# Patient Record
Sex: Female | Born: 1995 | Hispanic: Yes | Marital: Single | Smoking: Never smoker
Health system: Southern US, Community
[De-identification: ages and names within clinical notes are randomized; demographics above are authoritative.]

## PROBLEM LIST (undated history)

## (undated) DIAGNOSIS — O99019 Anemia complicating pregnancy, unspecified trimester: Secondary | ICD-10-CM

---
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
## 2018-10-07 ENCOUNTER — Encounter: Payer: Self-pay | Admitting: Emergency Medicine

## 2018-10-07 ENCOUNTER — Other Ambulatory Visit: Payer: Self-pay

## 2018-10-07 DIAGNOSIS — M545 Low back pain: Secondary | ICD-10-CM | POA: Insufficient documentation

## 2018-10-07 LAB — URINALYSIS, COMPLETE (UACMP) WITH MICROSCOPIC
Bacteria, UA: NONE SEEN
Bilirubin Urine: NEGATIVE
Glucose, UA: NEGATIVE mg/dL
Ketones, ur: NEGATIVE mg/dL
Leukocytes,Ua: NEGATIVE
Nitrite: NEGATIVE
Protein, ur: NEGATIVE mg/dL
Specific Gravity, Urine: 1.023 (ref 1.005–1.030)
pH: 6 (ref 5.0–8.0)

## 2018-10-07 LAB — POCT PREGNANCY, URINE: Preg Test, Ur: NEGATIVE

## 2018-10-07 NOTE — ED Triage Notes (Signed)
Patient ambulatory to triage with steady gait, without difficulty or distress noted; pt reports lower back pain since yesterday that increases with movement; denies any accomp symptoms, denies injury or hx of same

## 2018-10-08 ENCOUNTER — Emergency Department
Admission: EM | Admit: 2018-10-08 | Discharge: 2018-10-08 | Disposition: A | Discharge status: Home / Self Care | Payer: Self-pay | Attending: Emergency Medicine | Admitting: Emergency Medicine

## 2018-10-08 ENCOUNTER — Emergency Department: Payer: Self-pay

## 2018-10-08 DIAGNOSIS — M545 Low back pain, unspecified: Secondary | ICD-10-CM

## 2018-10-08 MED ORDER — CYCLOBENZAPRINE HCL 10 MG PO TABS
5.0000 mg | ORAL_TABLET | Freq: Once | ORAL | Status: AC
  Administered 2018-10-08: 02:00:00 5 mg via ORAL
  Filled 2018-10-08: qty 1

## 2018-10-08 MED ORDER — LIDOCAINE 5 % EX PTCH
1.0000 | MEDICATED_PATCH | CUTANEOUS | Status: DC
  Administered 2018-10-08: 1 via TRANSDERMAL
  Filled 2018-10-08: qty 1

## 2018-10-08 MED ORDER — CYCLOBENZAPRINE HCL 5 MG PO TABS: 5.0000 mg | ORAL_TABLET | Freq: Three times a day (TID) | ORAL | 0 refills | Status: DC | PRN

## 2018-10-08 NOTE — ED Notes (Signed)
Patient transported to X-ray 

## 2018-10-08 NOTE — ED Provider Notes (Signed)
[facility] Emergency Department Provider Note ______________   First MD Initiated Contact with Patient 10/08/18 0141     (approximate)  I have reviewed the triage vital signs and the nursing notes.   HISTORY  Chief Complaint Back Pain   HPI Christine Knox is a 23 y.o. female presents to the emergency department nontraumatic left lower back pain with no accompanying symptoms.  Patient denies any urinary symptoms no dysuria frequency or urgency.  Patient denies any hematuria.  Patient denies any fever.  Patient denies any abdominal pain no nausea or vomiting.  Patient states that the pain began yesterday and is worsened with movement.        History reviewed. No pertinent past medical history.  There are no active problems to display for this patient.   History reviewed. No pertinent surgical history.  Prior to Admission medications   Not on File    Allergies Patient has no known allergies.  No family history on file.  Social History Social History   Tobacco Use  . Smoking status: Never Smoker  . Smokeless tobacco: Never Used  Substance Use Topics  . Alcohol use: Not on file  . Drug use: Not on file    Review of Systems Constitutional: No fever/chills Eyes: No visual changes. ENT: No sore throat. Cardiovascular: Denies chest pain. Respiratory: Denies shortness of breath. Gastrointestinal: No abdominal pain.  No nausea, no vomiting.  No diarrhea.  No constipation. Genitourinary: Negative for dysuria. Musculoskeletal: Negative for neck pain.  Positive for back pain. Integumentary: Negative for rash. Neurological: Negative for headaches, focal weakness or numbness.  ____________________________________________   PHYSICAL EXAM:  VITAL SIGNS: ED Triage Vitals [10/07/18 2240]  Enc Vitals Group     BP 131/79     Pulse Rate 84     Resp 18     Temp      Temp Source Oral     SpO2 98 %     Weight 48.1 kg (106 lb)     Height  1.549 m (5\' 1" )     Head Circumference      Peak Flow      Pain Score 7     Pain Loc      Pain Edu?      Excl. in [place]?     Constitutional: Alert and oriented. Well appearing and in no acute distress. Eyes: Conjunctivae are normal.  Mouth/Throat: Mucous membranes are moist.  Oropharynx non-erythematous. Neck: No stridor.  Cardiovascular: Normal rate, regular rhythm. Good peripheral circulation. Grossly normal heart sounds. Respiratory: Normal respiratory effort.  No retractions. No audible wheezing. Gastrointestinal: Soft and nontender. No distention.  No CVA tenderness Musculoskeletal: Pain with left lumbar paraspinal/quadratus lumborum palpation Neurologic:  Normal speech and language. No gross focal neurologic deficits are appreciated.  Skin:  Skin is warm, dry and intact. No rash noted. Psychiatric: Mood and affect are normal. Speech and behavior are normal.  ____________________________________________   LABS (all labs ordered are listed, but only abnormal results are displayed)  Labs Reviewed  URINALYSIS, COMPLETE (UACMP) WITH MICROSCOPIC - Abnormal; Notable for the following components:      Result Value   Color, Urine YELLOW (*)    APPearance HAZY (*)    Hgb urine dipstick SMALL (*)    All other components within normal limits  POCT PREGNANCY, URINE   _____________________________ RADIOLOGY I, [place] N Geraldyn Shain, personally viewed and evaluated these images (plain radiographs) as part of my medical decision making, as well  as reviewing the written report by the radiologist.  ED MD interpretation: Negative lumbar spine x-ray  Official radiology report(s): Dg Lumbar Spine Complete  Result Date: 10/08/2018 CLINICAL DATA:  Lumbosacral back pain. EXAM: LUMBAR SPINE - COMPLETE 4+ VIEW COMPARISON:  None. FINDINGS: The alignment is maintained. Vertebral body heights are normal. There is no listhesis. The posterior elements are intact. Disc spaces are preserved. No fracture.  Sacroiliac joints are symmetric and normal. IMPRESSION: Negative radiographs of the lumbar spine. Electronically Signed   By: Narda RutherfordMelanie  Sanford M.D.   On: 10/08/2018 02:24     Procedures   ____________________________________________   INITIAL IMPRESSION / MDM / ASSESSMENT AND PLAN / ED COURSE  As part of my medical decision making, I reviewed the following data within the electronic MEDICAL RECORD NUMBER   23 year old female presenting with above-stated history and physical exam secondary to nontraumatic left lower back pain.  No fever.  No neurological deficits.  Pain does not radiate to the lower extremities.  Lidoderm patch applied patient given Flexeril with improvement of pain.  Patient will be prescribed Flexeril and Lidoderm patch for home.      ____________________________________________  FINAL CLINICAL IMPRESSION(S) / ED DIAGNOSES  Final diagnoses:  Acute left-sided low back pain without sciatica     MEDICATIONS GIVEN DURING THIS VISIT:  Medications  lidocaine (LIDODERM) 5 % 1 patch (1 patch Transdermal Patch Applied 10/08/18 0200)  cyclobenzaprine (FLEXERIL) tablet 5 mg (5 mg Oral Given 10/08/18 0202)     ED Discharge Orders    None      *Please note:  Christine Knox was evaluated in Emergency Department on 10/08/2018 for the symptoms described in the history of present illness. She was evaluated in the context of the global COVID-19 pandemic, which necessitated consideration that the patient might be at risk for infection with the SARS-CoV-2 virus that causes COVID-19. Institutional protocols and algorithms that pertain to the evaluation of patients at risk for COVID-19 are in a state of rapid change based on information released by regulatory bodies including the CDC and federal and state organizations. These policies and algorithms were followed during the patient's care in the ED.  Some ED evaluations and interventions may be delayed as a result of limited staffing  during the pandemic.*  Note:  This document was prepared using Dragon voice recognition software and may include unintentional dictation errors.   Darci CurrentBrown, Stevenson N, MD 10/08/18 [id number]

## 2020-03-19 NOTE — L&D Delivery Note (Signed)
Delivery Note  Christine Knox is a G1P0101 at [redacted]w[redacted]d with an LMP of 04/06/2020, consistent with [place] at [redacted]w[redacted]d.   First Stage: Labor onset: 12/18/2020 at 2000  Augmentation:  None Analgesia /Anesthesia intrapartum: Epidural SROM at 0458  Second Stage: Complete dilation at 1058 Onset of pushing at 1204 FHR second stage 150bpm with moderate variability, variables with contractions during pushing.   Christine Knox, presented to L&D with regular painful contractions and was admitted for preterm labor.  Her labor progressed more rapidly after SROM.  She progressed spontaneously to C/C/+1 but did not have an urge to push d/t the epidural.  The epidural rate was decreased and she reported pressure after an hour.  Christine Knox was assisted with pushing techniques and pushed effectively over approximately 40 minutes for a spontaneous vaginal birth.  Delivery of a viable baby girl on 12/19/2020 at 1245 by CNM Delivery of fetal head in OA position with restitution to ROT. No nuchal cord;  Left, compound hand reduced. Anterior then posterior shoulders delivered easily with gentle downward traction. Baby placed on mom's chest, and attended to by baby RN Cord double clamped after cessation of pulsation, cut by father of baby.   Cord blood sample collected: O pos    Third Stage: Oxytocin bolus started after delivery of infant for hemorrhage prophylaxis  Placenta delivered intact with 3 VC @ 1253 Placenta disposition: discarded  Uterine tone firm / bleeding moderate   Bilateral labial laceration identified  Anesthesia for repair: Epidural  Repair of left labial laceration with 2-0 Vicryl CT in continuous suture to achieve anastomosis.  Laceration extended to left periclitoral.  Hemostatic and approximates well, inline with labial laceration.  No additional suture used for periclitoral extension.   Repair of right labial laceration with 3-0 Vicryl SH in continuous suture to achieve anastomosis.   Est. Blood Loss  (mL): 412 ml  Complications: None  Mom to postpartum.  Baby to Couplet care / Skin to Skin.  Newborn: Information for the patient's newborn:  Christine Knox, Christine Knox [[id number]]  Live born female  Birth Weight: 5 lb 15.2 oz (2700 g) APGAR: 8, 9  Newborn Delivery   Birth date/time: 12/19/2020 12:45:00 Delivery type: Vaginal, Spontaneous        Feeding planned: Breast and formula   ---------- [name], CNM Certified Nurse Midwife [facility]

## 2020-06-22 DIAGNOSIS — R300 Dysuria: Secondary | ICD-10-CM | POA: Diagnosis not present

## 2020-06-28 DIAGNOSIS — Z3400 Encounter for supervision of normal first pregnancy, unspecified trimester: Secondary | ICD-10-CM | POA: Insufficient documentation

## 2020-07-08 DIAGNOSIS — Z1329 Encounter for screening for other suspected endocrine disorder: Secondary | ICD-10-CM | POA: Diagnosis not present

## 2020-07-08 DIAGNOSIS — Z131 Encounter for screening for diabetes mellitus: Secondary | ICD-10-CM | POA: Diagnosis not present

## 2020-07-08 DIAGNOSIS — Z3401 Encounter for supervision of normal first pregnancy, first trimester: Secondary | ICD-10-CM | POA: Diagnosis not present

## 2020-07-08 DIAGNOSIS — N898 Other specified noninflammatory disorders of vagina: Secondary | ICD-10-CM | POA: Diagnosis not present

## 2020-07-08 DIAGNOSIS — N760 Acute vaginitis: Secondary | ICD-10-CM | POA: Diagnosis not present

## 2020-07-08 DIAGNOSIS — B373 Candidiasis of vulva and vagina: Secondary | ICD-10-CM | POA: Diagnosis not present

## 2020-07-08 DIAGNOSIS — B9689 Other specified bacterial agents as the cause of diseases classified elsewhere: Secondary | ICD-10-CM | POA: Diagnosis not present

## 2020-07-08 DIAGNOSIS — O99891 Other specified diseases and conditions complicating pregnancy: Secondary | ICD-10-CM | POA: Diagnosis not present

## 2020-07-08 LAB — OB RESULTS CONSOLE HEPATITIS B SURFACE ANTIGEN: Hepatitis B Surface Ag: NEGATIVE

## 2020-07-08 LAB — OB RESULTS CONSOLE RUBELLA ANTIBODY, IGM: Rubella: NON-IMMUNE/NOT IMMUNE

## 2020-07-08 LAB — OB RESULTS CONSOLE VARICELLA ZOSTER ANTIBODY, IGG: Varicella: IMMUNE

## 2020-08-26 DIAGNOSIS — Z3402 Encounter for supervision of normal first pregnancy, second trimester: Secondary | ICD-10-CM | POA: Diagnosis not present

## 2020-10-17 DIAGNOSIS — Z23 Encounter for immunization: Secondary | ICD-10-CM | POA: Diagnosis not present

## 2020-10-17 DIAGNOSIS — Z3402 Encounter for supervision of normal first pregnancy, second trimester: Secondary | ICD-10-CM | POA: Diagnosis not present

## 2020-11-04 DIAGNOSIS — O26843 Uterine size-date discrepancy, third trimester: Secondary | ICD-10-CM | POA: Diagnosis not present

## 2020-11-18 ENCOUNTER — Other Ambulatory Visit: Payer: Self-pay

## 2020-11-18 DIAGNOSIS — D509 Iron deficiency anemia, unspecified: Secondary | ICD-10-CM | POA: Insufficient documentation

## 2020-11-18 DIAGNOSIS — O26843 Uterine size-date discrepancy, third trimester: Secondary | ICD-10-CM | POA: Insufficient documentation

## 2020-11-18 DIAGNOSIS — O99013 Anemia complicating pregnancy, third trimester: Secondary | ICD-10-CM | POA: Diagnosis not present

## 2020-11-25 ENCOUNTER — Other Ambulatory Visit: Payer: Self-pay

## 2020-11-25 ENCOUNTER — Ambulatory Visit
Admission: RE | Admit: 2020-11-25 | Discharge: 2020-11-25 | Disposition: A | Discharge status: Home / Self Care | Payer: Medicaid Other | Source: Ambulatory Visit

## 2020-11-25 DIAGNOSIS — O99013 Anemia complicating pregnancy, third trimester: Secondary | ICD-10-CM | POA: Insufficient documentation

## 2020-11-25 DIAGNOSIS — D509 Iron deficiency anemia, unspecified: Secondary | ICD-10-CM | POA: Diagnosis not present

## 2020-11-25 DIAGNOSIS — Z3A00 Weeks of gestation of pregnancy not specified: Secondary | ICD-10-CM | POA: Diagnosis not present

## 2020-11-25 MED ORDER — SODIUM CHLORIDE 0.9 % IV SOLN
200.0000 mg | INTRAVENOUS | Status: DC
  Administered 2020-11-25: 200 mg via INTRAVENOUS
  Filled 2020-11-25: qty 10

## 2020-12-02 ENCOUNTER — Other Ambulatory Visit: Payer: Self-pay

## 2020-12-02 ENCOUNTER — Ambulatory Visit
Admission: RE | Admit: 2020-12-02 | Discharge: 2020-12-02 | Disposition: A | Discharge status: Home / Self Care | Payer: Medicaid Other | Source: Ambulatory Visit

## 2020-12-02 DIAGNOSIS — O26843 Uterine size-date discrepancy, third trimester: Secondary | ICD-10-CM | POA: Diagnosis not present

## 2020-12-02 DIAGNOSIS — O99013 Anemia complicating pregnancy, third trimester: Secondary | ICD-10-CM | POA: Diagnosis not present

## 2020-12-02 DIAGNOSIS — D509 Iron deficiency anemia, unspecified: Secondary | ICD-10-CM | POA: Diagnosis not present

## 2020-12-02 HISTORY — DX: Anemia complicating pregnancy, unspecified trimester: O99.019

## 2020-12-02 MED ORDER — SODIUM CHLORIDE 0.9 % IV SOLN
200.0000 mg | Freq: Once | INTRAVENOUS | Status: AC
  Administered 2020-12-02: 200 mg via INTRAVENOUS
  Filled 2020-12-02: qty 10

## 2020-12-09 ENCOUNTER — Encounter
Admission: RE | Admit: 2020-12-09 | Discharge: 2020-12-09 | Disposition: A | Discharge status: Home / Self Care | Payer: BC Managed Care – PPO | Source: Ambulatory Visit

## 2020-12-09 ENCOUNTER — Other Ambulatory Visit: Payer: Self-pay

## 2020-12-09 DIAGNOSIS — Z3A00 Weeks of gestation of pregnancy not specified: Secondary | ICD-10-CM | POA: Diagnosis not present

## 2020-12-09 DIAGNOSIS — O99013 Anemia complicating pregnancy, third trimester: Secondary | ICD-10-CM | POA: Insufficient documentation

## 2020-12-09 DIAGNOSIS — D509 Iron deficiency anemia, unspecified: Secondary | ICD-10-CM | POA: Diagnosis not present

## 2020-12-09 MED ORDER — SODIUM CHLORIDE 0.9 % IV SOLN
200.0000 mg | Freq: Once | INTRAVENOUS | Status: AC
  Administered 2020-12-09: 200 mg via INTRAVENOUS
  Filled 2020-12-09: qty 200

## 2020-12-15 DIAGNOSIS — Z3403 Encounter for supervision of normal first pregnancy, third trimester: Secondary | ICD-10-CM | POA: Diagnosis not present

## 2020-12-15 DIAGNOSIS — O99013 Anemia complicating pregnancy, third trimester: Secondary | ICD-10-CM | POA: Diagnosis not present

## 2020-12-15 DIAGNOSIS — D509 Iron deficiency anemia, unspecified: Secondary | ICD-10-CM | POA: Diagnosis not present

## 2020-12-15 LAB — OB RESULTS CONSOLE GC/CHLAMYDIA
Chlamydia: NEGATIVE
Gonorrhea: NEGATIVE

## 2020-12-15 LAB — OB RESULTS CONSOLE HIV ANTIBODY (ROUTINE TESTING): HIV: NONREACTIVE

## 2020-12-15 LAB — OB RESULTS CONSOLE GBS: GBS: NEGATIVE

## 2020-12-15 LAB — OB RESULTS CONSOLE RPR: RPR: NONREACTIVE

## 2020-12-16 ENCOUNTER — Other Ambulatory Visit: Payer: Self-pay

## 2020-12-16 ENCOUNTER — Ambulatory Visit
Admission: RE | Admit: 2020-12-16 | Discharge: 2020-12-16 | Disposition: A | Discharge status: Home / Self Care | Payer: BLUE CROSS/BLUE SHIELD | Source: Ambulatory Visit

## 2020-12-16 DIAGNOSIS — Z3A00 Weeks of gestation of pregnancy not specified: Secondary | ICD-10-CM | POA: Diagnosis not present

## 2020-12-16 DIAGNOSIS — O99013 Anemia complicating pregnancy, third trimester: Secondary | ICD-10-CM | POA: Insufficient documentation

## 2020-12-16 DIAGNOSIS — D509 Iron deficiency anemia, unspecified: Secondary | ICD-10-CM | POA: Diagnosis not present

## 2020-12-16 MED ORDER — SODIUM CHLORIDE 0.9 % IV SOLN
200.0000 mg | Freq: Once | INTRAVENOUS | Status: AC
  Administered 2020-12-16: 200 mg via INTRAVENOUS
  Filled 2020-12-16: qty 200

## 2020-12-18 ENCOUNTER — Inpatient Hospital Stay
Admission: EM | Admit: 2020-12-18 | Discharge: 2020-12-21 | DRG: 807 | Disposition: A | Discharge status: Home / Self Care | Payer: Medicaid Other | Attending: Obstetrics and Gynecology | Admitting: Obstetrics and Gynecology

## 2020-12-18 ENCOUNTER — Inpatient Hospital Stay: Payer: Medicaid Other

## 2020-12-18 ENCOUNTER — Other Ambulatory Visit: Payer: Self-pay

## 2020-12-18 DIAGNOSIS — O9902 Anemia complicating childbirth: Secondary | ICD-10-CM | POA: Diagnosis present

## 2020-12-18 DIAGNOSIS — O9279 Other disorders of lactation: Secondary | ICD-10-CM | POA: Diagnosis not present

## 2020-12-18 DIAGNOSIS — Z20822 Contact with and (suspected) exposure to covid-19: Secondary | ICD-10-CM | POA: Diagnosis not present

## 2020-12-18 DIAGNOSIS — Z3A36 36 weeks gestation of pregnancy: Secondary | ICD-10-CM

## 2020-12-18 DIAGNOSIS — O326XX0 Maternal care for compound presentation, not applicable or unspecified: Secondary | ICD-10-CM | POA: Diagnosis present

## 2020-12-18 DIAGNOSIS — D509 Iron deficiency anemia, unspecified: Secondary | ICD-10-CM | POA: Diagnosis present

## 2020-12-18 DIAGNOSIS — O4703 False labor before 37 completed weeks of gestation, third trimester: Secondary | ICD-10-CM | POA: Diagnosis present

## 2020-12-18 DIAGNOSIS — O368130 Decreased fetal movements, third trimester, not applicable or unspecified: Principal | ICD-10-CM

## 2020-12-18 DIAGNOSIS — D62 Acute posthemorrhagic anemia: Secondary | ICD-10-CM | POA: Diagnosis not present

## 2020-12-18 LAB — CBC
HCT: 33.9 % — ABNORMAL LOW (ref 36.0–46.0)
Hemoglobin: 11.3 g/dL — ABNORMAL LOW (ref 12.0–15.0)
MCH: 30.2 pg (ref 26.0–34.0)
MCHC: 33.3 g/dL (ref 30.0–36.0)
MCV: 90.6 fL (ref 80.0–100.0)
Platelets: 171 10*3/uL (ref 150–400)
RBC: 3.74 MIL/uL — ABNORMAL LOW (ref 3.87–5.11)
RDW: 18.5 % — ABNORMAL HIGH (ref 11.5–15.5)
WBC: 6.8 10*3/uL (ref 4.0–10.5)
nRBC: 0 % (ref 0.0–0.2)

## 2020-12-18 MED ORDER — OXYTOCIN BOLUS FROM INFUSION
333.0000 mL | Freq: Once | INTRAVENOUS | Status: AC
Start: 2020-12-19 — End: 2020-12-19
  Administered 2020-12-19: 333 mL via INTRAVENOUS

## 2020-12-18 MED ORDER — LIDOCAINE HCL (PF) 1 % IJ SOLN
INTRAMUSCULAR | Status: AC
  Filled 2020-12-18: qty 30

## 2020-12-18 MED ORDER — SOD CITRATE-CITRIC ACID 500-334 MG/5ML PO SOLN: 30.0000 mL | ORAL | Status: DC | PRN

## 2020-12-18 MED ORDER — LACTATED RINGERS IV BOLUS
1000.0000 mL | Freq: Once | INTRAVENOUS | Status: AC
  Administered 2020-12-19: 1000 mL via INTRAVENOUS

## 2020-12-18 MED ORDER — FENTANYL CITRATE (PF) 100 MCG/2ML IJ SOLN
50.0000 ug | INTRAMUSCULAR | Status: DC | PRN
  Administered 2020-12-19: 100 ug via INTRAVENOUS
  Filled 2020-12-18: qty 2

## 2020-12-18 MED ORDER — ACETAMINOPHEN 325 MG PO TABS
650.0000 mg | ORAL_TABLET | ORAL | Status: DC | PRN
Start: 2020-12-18 — End: 2020-12-19

## 2020-12-18 MED ORDER — LACTATED RINGERS IV SOLN: 500.0000 mL | INTRAVENOUS | Status: DC | PRN

## 2020-12-18 MED ORDER — MISOPROSTOL 200 MCG PO TABS
ORAL_TABLET | ORAL | Status: AC
  Filled 2020-12-18: qty 4

## 2020-12-18 MED ORDER — OXYTOCIN 10 UNIT/ML IJ SOLN
INTRAMUSCULAR | Status: AC
  Filled 2020-12-18: qty 2

## 2020-12-18 MED ORDER — OXYTOCIN-SODIUM CHLORIDE 30-0.9 UT/500ML-% IV SOLN
2.5000 [IU]/h | INTRAVENOUS | Status: DC
  Administered 2020-12-19: 2.5 [IU]/h via INTRAVENOUS
  Filled 2020-12-18: qty 500

## 2020-12-18 MED ORDER — ONDANSETRON HCL 4 MG/2ML IJ SOLN: 4.0000 mg | Freq: Four times a day (QID) | INTRAMUSCULAR | Status: DC | PRN

## 2020-12-18 MED ORDER — AMMONIA AROMATIC IN INHA
RESPIRATORY_TRACT | Status: AC
  Filled 2020-12-18: qty 10

## 2020-12-18 MED ORDER — LACTATED RINGERS IV SOLN: INTRAVENOUS | Status: DC

## 2020-12-18 MED ORDER — OXYTOCIN-SODIUM CHLORIDE 30-0.9 UT/500ML-% IV SOLN
INTRAVENOUS | Status: AC
  Filled 2020-12-18: qty 500

## 2020-12-18 MED ORDER — LIDOCAINE HCL (PF) 1 % IJ SOLN
30.0000 mL | INTRAMUSCULAR | Status: DC | PRN
Start: 2020-12-18 — End: 2020-12-19

## 2020-12-18 NOTE — OB Triage Note (Signed)
Pt is a G1P0 and [redacted]w[redacted]d presenting to L&D with c/o ctx every 7-8 min lasting 40 sec each that started around 2000 tonight. Pt reports discomfort to be in her lower abdomen and describes it as cramping. Pt also reports vaginal bleeding that started Friday and has occurred a few times through the weekend when she wipes. Pt describes it as "pink/red mucus." Pt states she has not felt her baby move this evening. VSS. Monitors applied and assessing. Pt denies LOF. CNM aware of pts arrival and triage report given.

## 2020-12-18 NOTE — H&P (Signed)
OB History & Physical   History of Present Illness:  Chief Complaint: cramping, pink tinged discharge  HPI:  Christine Knox is a 25 y.o. G1P0 female at [redacted]w[redacted]d dated by LMP 04/06/20 and c/w [place] at [redacted]w[redacted]d.  She presents to L&D with c/o ctx every 7-8 min lasting 40 sec each that started around 2000 tonight. Pt reports discomfort to be in her lower abdomen and describes it as cramping. Pt also reports vaginal bleeding that started Friday and has occurred a few times through the weekend when she wipes. Pt describes it as "pink/red mucus." Pt states she has not felt her baby move this evening.  - initial assessment with reactive tracing and SVE 4/80/-1, decision to admit for preterm labor.   Pregnancy Issues: Rubella Non-Immune Anemia, iron deficiency; s/p iron supplements, improved Hgb 11.0 at 36wks S<D, last growth [place]: 12/02/20: EFW=5lbs4oz (7829F) =39%. AFI=11.13cm @ 28%. Plac=Ant, Position=Vertex, spine rt. FHT=143 BPM   Maternal Medical History:   Past Medical History:  Diagnosis Date   Anemia in pregnancy     History reviewed. No pertinent surgical history.  No Known Allergies  Prior to Admission medications   Medication Sig Start Date End Date Taking? Authorizing Provider  Ferrous Sulfate (IRON SUPPLEMENT PO) Take 1 tablet by mouth. Iron gummies over the counter   Yes [provider]  Prenatal Vit-Fe Fumarate-FA (PRENATAL VITAMINS PO) Take by mouth.   Yes [provider]  vitamin B-6 (PYRIDOXINE) 25 MG tablet Take 25 mg by mouth daily.   Yes [provider]  cyclobenzaprine (FLEXERIL) 5 MG tablet Take 1 tablet (5 mg total) by mouth 3 (three) times daily as needed for muscle spasms. Patient not taking: No sig reported 10/08/18   Darci Current, MD     Prenatal care site: [facility]   Social History: She  reports that she has never smoked. She has never used smokeless tobacco. She reports that she does not drink alcohol and does not use  drugs.  Family History: family history includes Hypertension in her mother; Stroke in her sister.   Review of Systems: A full review of systems was performed and negative except as noted in the HPI.     Physical Exam:  Vital Signs: BP 133/74 (BP Location: Right Arm)   Pulse 83   Temp 98.1 F (36.7 C) (Oral)   Resp 16   Ht 5' (1.524 m)   Wt 59 kg   BMI 25.39 kg/m  General: no acute distress.  HEENT: normocephalic, atraumatic Heart: regular rate & rhythm.  No murmurs/rubs/gallops Lungs: clear to auscultation bilaterally, normal respiratory effort Abdomen: soft, gravid, non-tender;  EFW: 6.5lbs Pelvic:   External: Normal external female genitalia Extremities: non-tender, symmetric, no edema bilaterally.  DTRs: 2+  Neurologic: Alert & oriented x 3.    No results found for this or any previous visit (from the past 24 hour(s)).  Pertinent Results:  Prenatal Labs: Blood type/Rh O pos  Antibody screen neg  Rubella Immune  Varicella Immune  RPR NR  HBsAg Neg  HIV NR  GC neg  Chlamydia neg  Genetic screening declined  1 hour GTT  118  3 hour GTT   GBS  Neg   FHT: 135bpm, mod variability, + accels, no decels TOCO: q3-101min, UI noted.  SVE:  Dilation: 4 / Effacement (%): 80 / Station: -1    Cephalic by leopolds/SVE  No results found.  Assessment:  Christine Knox is a 25 y.o. G1P0 female at [redacted]w[redacted]d  with preterm labor.   Plan:  1. Admit to Labor & Delivery; consents reviewed and obtained - COVID screen on admit - US OB limited for AFI due to decreased FM, last AFI was 28%, 11cm  2. Fetal Well being  - Fetal Tracing: Cat I tracing - Group B Streptococcus ppx indicated: Negative - Presentation: cephalic confirmed by exam   3. Routine OB: - Prenatal labs reviewed, as above - Rh O Pos - CBC, T&S, RPR on admit - Clear fluids, IVF  4. Monitoring of Labor -  Contractions: external toco in place -  Pelvis adequate for TOL -  Plan for expectant mgmt due to  prematurity.  -  Plan for continuous fetal monitoring  -  Maternal pain control as desired - Anticipate vaginal delivery  5. Post Partum Planning: - Infant feeding: TBD - Tdap: 10/17/20 - Contraception: TBD  Prudencio Pair Audrielle Vankuren, CNM 12/18/20 11:20 PM

## 2020-12-18 NOTE — Progress Notes (Signed)
Pt transported to ultrasound accompanied by RN

## 2020-12-19 ENCOUNTER — Inpatient Hospital Stay: Payer: Medicaid Other | Admitting: Anesthesiology

## 2020-12-19 ENCOUNTER — Encounter: Payer: Self-pay | Admitting: Obstetrics and Gynecology

## 2020-12-19 DIAGNOSIS — O368130 Decreased fetal movements, third trimester, not applicable or unspecified: Secondary | ICD-10-CM | POA: Diagnosis not present

## 2020-12-19 DIAGNOSIS — Z3A36 36 weeks gestation of pregnancy: Secondary | ICD-10-CM | POA: Diagnosis not present

## 2020-12-19 LAB — COMPREHENSIVE METABOLIC PANEL
ALT: 14 U/L (ref 0–44)
AST: 31 U/L (ref 15–41)
Albumin: 3.3 g/dL — ABNORMAL LOW (ref 3.5–5.0)
Alkaline Phosphatase: 337 U/L — ABNORMAL HIGH (ref 38–126)
Anion gap: 10 (ref 5–15)
BUN: 7 mg/dL (ref 6–20)
CO2: 20 mmol/L — ABNORMAL LOW (ref 22–32)
Calcium: 8.8 mg/dL — ABNORMAL LOW (ref 8.9–10.3)
Chloride: 106 mmol/L (ref 98–111)
Creatinine, Ser: 0.61 mg/dL (ref 0.44–1.00)
GFR, Estimated: >60 mL/min (ref >60)
Glucose, Bld: 82 mg/dL (ref 70–99)
Potassium: 3.6 mmol/L (ref 3.5–5.1)
Sodium: 136 mmol/L (ref 135–145)
Total Bilirubin: 0.6 mg/dL (ref 0.3–1.2)
Total Protein: 7 g/dL (ref 6.5–8.1)

## 2020-12-19 LAB — TYPE AND SCREEN
ABO/RH(D): O POS
Antibody Screen: NEGATIVE

## 2020-12-19 LAB — RESP PANEL BY RT-PCR (FLU A&B, COVID) ARPGX2
Influenza A by PCR: NEGATIVE
Influenza B by PCR: NEGATIVE
SARS Coronavirus 2 by RT PCR: NEGATIVE

## 2020-12-19 LAB — ABO/RH: ABO/RH(D): O POS

## 2020-12-19 LAB — RPR: RPR Ser Ql: NONREACTIVE

## 2020-12-19 LAB — PROTEIN / CREATININE RATIO, URINE
Creatinine, Urine: 52 mg/dL
Total Protein, Urine: <6 mg/dL

## 2020-12-19 MED ORDER — DOCUSATE SODIUM 100 MG PO CAPS
100.0000 mg | ORAL_CAPSULE | Freq: Two times a day (BID) | ORAL | Status: DC
  Administered 2020-12-19 – 2020-12-21 (×4): 100 mg via ORAL
  Filled 2020-12-19 (×4): qty 1

## 2020-12-19 MED ORDER — ONDANSETRON HCL 4 MG PO TABS
4.0000 mg | ORAL_TABLET | ORAL | Status: DC | PRN
  Filled 2020-12-19: qty 1

## 2020-12-19 MED ORDER — BENZOCAINE-MENTHOL 20-0.5 % EX AERO: 1.0000 "application " | INHALATION_SPRAY | CUTANEOUS | Status: DC | PRN

## 2020-12-19 MED ORDER — FENTANYL-BUPIVACAINE-NACL 0.5-0.125-0.9 MG/250ML-% EP SOLN: 12.0000 mL/h | EPIDURAL | Status: DC | PRN

## 2020-12-19 MED ORDER — BENZOCAINE-MENTHOL 20-0.5 % EX AERO
INHALATION_SPRAY | CUTANEOUS | Status: AC
  Filled 2020-12-19: qty 56

## 2020-12-19 MED ORDER — MEASLES, MUMPS & RUBELLA VAC IJ SOLR
0.5000 mL | INTRAMUSCULAR | Status: DC | PRN
  Filled 2020-12-19: qty 0.5

## 2020-12-19 MED ORDER — PHENYLEPHRINE 40 MCG/ML (10ML) SYRINGE FOR IV PUSH (FOR BLOOD PRESSURE SUPPORT): 80.0000 ug | PREFILLED_SYRINGE | INTRAVENOUS | Status: DC | PRN

## 2020-12-19 MED ORDER — EPHEDRINE 5 MG/ML INJ
10.0000 mg | INTRAVENOUS | Status: DC | PRN
Start: 2020-12-19 — End: 2020-12-19

## 2020-12-19 MED ORDER — LIDOCAINE-EPINEPHRINE (PF) 1.5 %-1:200000 IJ SOLN
INTRAMUSCULAR | Status: DC | PRN
  Administered 2020-12-19: 3 mL via PERINEURAL

## 2020-12-19 MED ORDER — ACETAMINOPHEN 500 MG PO TABS: 1000.0000 mg | ORAL_TABLET | Freq: Four times a day (QID) | ORAL | Status: DC | PRN

## 2020-12-19 MED ORDER — IBUPROFEN 600 MG PO TABS
600.0000 mg | ORAL_TABLET | Freq: Four times a day (QID) | ORAL | Status: DC
  Administered 2020-12-19 – 2020-12-21 (×7): 600 mg via ORAL
  Filled 2020-12-19 (×7): qty 1

## 2020-12-19 MED ORDER — ONDANSETRON HCL 4 MG/2ML IJ SOLN: 4.0000 mg | INTRAMUSCULAR | Status: DC | PRN

## 2020-12-19 MED ORDER — DIPHENHYDRAMINE HCL 25 MG PO CAPS: 25.0000 mg | ORAL_CAPSULE | Freq: Four times a day (QID) | ORAL | Status: DC | PRN

## 2020-12-19 MED ORDER — INFLUENZA VAC SPLIT QUAD 0.5 ML IM SUSY: 0.5000 mL | PREFILLED_SYRINGE | INTRAMUSCULAR | Status: DC | PRN

## 2020-12-19 MED ORDER — LIDOCAINE HCL (PF) 1 % IJ SOLN: INTRAMUSCULAR | Status: DC | PRN

## 2020-12-19 MED ORDER — COCONUT OIL OIL
1.0000 "application " | TOPICAL_OIL | Status: DC | PRN
  Administered 2020-12-20: 1 via TOPICAL
  Filled 2020-12-19: qty 120

## 2020-12-19 MED ORDER — PRENATAL MULTIVITAMIN CH
1.0000 | ORAL_TABLET | Freq: Every day | ORAL | Status: DC
Start: 2020-12-20 — End: 2020-12-21
  Administered 2020-12-20 – 2020-12-21 (×2): 1 via ORAL
  Filled 2020-12-19 (×2): qty 1

## 2020-12-19 MED ORDER — EPHEDRINE 5 MG/ML INJ: 10.0000 mg | INTRAVENOUS | Status: DC | PRN

## 2020-12-19 MED ORDER — DIBUCAINE (PERIANAL) 1 % EX OINT
1.0000 "application " | TOPICAL_OINTMENT | CUTANEOUS | Status: DC | PRN
  Filled 2020-12-19: qty 28

## 2020-12-19 MED ORDER — DIPHENHYDRAMINE HCL 50 MG/ML IJ SOLN: 12.5000 mg | INTRAMUSCULAR | Status: DC | PRN

## 2020-12-19 MED ORDER — SIMETHICONE 80 MG PO CHEW: 80.0000 mg | CHEWABLE_TABLET | ORAL | Status: DC | PRN

## 2020-12-19 MED ORDER — PHENYLEPHRINE 40 MCG/ML (10ML) SYRINGE FOR IV PUSH (FOR BLOOD PRESSURE SUPPORT)
80.0000 ug | PREFILLED_SYRINGE | INTRAVENOUS | Status: DC | PRN
Start: 2020-12-19 — End: 2020-12-19

## 2020-12-19 MED ORDER — SODIUM CHLORIDE 0.9 % IV SOLN
INTRAVENOUS | Status: DC | PRN
  Administered 2020-12-19 (×2): 5 mL via EPIDURAL

## 2020-12-19 MED ORDER — WITCH HAZEL-GLYCERIN EX PADS
1.0000 "application " | MEDICATED_PAD | CUTANEOUS | Status: DC
  Administered 2020-12-20: 1 via TOPICAL
  Filled 2020-12-19: qty 100

## 2020-12-19 MED ORDER — FENTANYL-BUPIVACAINE-NACL 0.5-0.125-0.9 MG/250ML-% EP SOLN
EPIDURAL | Status: DC | PRN
  Administered 2020-12-19: 12 mL/h via EPIDURAL

## 2020-12-19 MED ORDER — LACTATED RINGERS IV SOLN
500.0000 mL | Freq: Once | INTRAVENOUS | Status: AC
  Administered 2020-12-19: 500 mL via INTRAVENOUS

## 2020-12-19 MED ORDER — FENTANYL-BUPIVACAINE-NACL 0.5-0.125-0.9 MG/250ML-% EP SOLN
EPIDURAL | Status: AC
  Filled 2020-12-19: qty 250

## 2020-12-19 MED ORDER — WITCH HAZEL-GLYCERIN EX PADS
MEDICATED_PAD | CUTANEOUS | Status: AC
  Filled 2020-12-19: qty 100

## 2020-12-19 NOTE — Anesthesia Preprocedure Evaluation (Signed)
Anesthesia Evaluation  Patient identified by MRN, date of birth, ID band Patient awake    Reviewed: Allergy & Precautions, NPO status , Patient's Chart, lab work & pertinent test results  Airway Mallampati: II   Neck ROM: Full    Dental no notable dental hx.    Pulmonary neg pulmonary ROS,    Pulmonary exam normal        Cardiovascular negative cardio ROS Normal cardiovascular exam     Neuro/Psych negative neurological ROS  negative psych ROS   GI/Hepatic negative GI ROS, Neg liver ROS,   Endo/Other  negative endocrine ROS  Renal/GU negative Renal ROS  negative genitourinary   Musculoskeletal negative musculoskeletal ROS (+)   Abdominal gravid  Peds negative pediatric ROS (+)  Hematology  (+) anemia ,   Anesthesia Other Findings   Reproductive/Obstetrics (+) Pregnancy                             Anesthesia Physical Anesthesia Plan  ASA: 2  Anesthesia Plan: Epidural   Post-op Pain Management:    Induction:   PONV Risk Score and Plan:   Airway Management Planned:   Additional Equipment:   Intra-op Plan:   Post-operative Plan:   Informed Consent: I have reviewed the patients History and Physical, chart, labs and discussed the procedure including the risks, benefits and alternatives for the proposed anesthesia with the patient or authorized representative who has indicated his/her understanding and acceptance.       Plan Discussed with: Anesthesiologist  Anesthesia Plan Comments:         Anesthesia Quick Evaluation

## 2020-12-19 NOTE — Progress Notes (Signed)
Pt called RN to room to assess "gush" she felt and that it felt like she was "peeing herself." Upon assessment, pad noted to have bloody show and mucus. Pt changed pad and will continue to assess.

## 2020-12-19 NOTE — Lactation Note (Signed)
This note was copied from a baby's chart. Lactation Consultation Note  Patient Name: Christine Knox E[facility]O Date: 12/19/2020 Reason for consult: L&D Initial assessment;Primapara;Late-preterm 34-36.6wks Age:25 hours  Lactation called to LDR2 to assist with first attempted feeding for P1, SVD <1hr prior, [redacted]w[redacted]d infant. Mom's feeding choice on admission is breast and bottle.  Mom has larger bulb like nipples, smaller areolar, compressible breast tissue. Baby was awake/alert, however had difficulty desiring to open mouth wide enough for mom's size nipple. After a few attempts baby did latch; mom noted discomfort. Attempts made to improve latch- flanging top/bottom lips, compressing/sandwiching the tissue, mom noted some change.  With baby at the breast LC inquired about mom's feeding preference- Mom states she would prefer to only pump and bottle feed along with formula. LC provided education on the consistency of colostrum, baby's ability to remove the colostrum versus a pump, and a feeding plan to incorporate mom's wishes.  LC encouraged mom to think about her path and to let myself or Transition RN know about feeding preferences. Mom verbalized understanding.  Maternal Data Does the patient have breastfeeding experience prior to this delivery?: No  Feeding Mother's Current Feeding Choice: Breast Milk and Formula Nipple Type: Slow - flow  LATCH Score Latch: Repeated attempts needed to sustain latch, nipple held in mouth throughout feeding, stimulation needed to elicit sucking reflex.  Audible Swallowing: A few with stimulation  Type of Nipple: Everted at rest and after stimulation  Comfort (Breast/Nipple): Soft / non-tender  Hold (Positioning): Full assist, staff holds infant at breast  LATCH Score: 6   Lactation Tools Discussed/Used    Interventions Interventions: Assisted with latch;Breast massage;Hand express;Support pillows;Position options;Adjust  position;Education  Discharge    Consult Status Consult Status: Follow-up from L&D    Danford Bad 12/19/2020, 5:08 PM

## 2020-12-19 NOTE — Anesthesia Procedure Notes (Signed)
Epidural Patient location during procedure: OB Start time: 12/19/2020 6:35 AM End time: 12/19/2020 6:54 AM  Staffing Anesthesiologist: Foye Deer, MD Performed: anesthesiologist   Preanesthetic Checklist Completed: patient identified, IV checked, site marked, risks and benefits discussed, surgical consent, monitors and equipment checked, pre-op evaluation and timeout performed  Epidural Patient position: sitting Prep: ChloraPrep Patient monitoring: heart rate, continuous pulse ox and blood pressure Approach: midline Location: L3-L4 Injection technique: LOR saline  Needle:  Needle type: Tuohy  Needle gauge: 18 G Needle length: 9 cm Needle insertion depth: 4 cm Catheter type: closed end Catheter size: 20 Guage Catheter at skin depth: 9 cm Test dose: negative and 1.5% lidocaine with Epi 1:200 K  Assessment Events: blood not aspirated and injection not painful  Additional Notes Reason for block:procedure for pain

## 2020-12-19 NOTE — Progress Notes (Signed)
Labor Progress Note  Christine Knox is a 25 y.o. G1P0 at [redacted]w[redacted]d by LMP admitted for Preterm labor  Subjective: feeling 10/10 pain with UCs, had some relief after IVPM around 0230. Suspect ROM at 0458- small amount of fluid gushed, bloody show noted by RN.   Objective: BP 136/82 (BP Location: Left Arm)   Pulse 65   Temp 98.1 F (36.7 C) (Oral)   Resp 16   Ht 5' (1.524 m)   Wt 59 kg   BMI 25.39 kg/m  Notable VS details: reviewed.   Fetal Assessment: FHT:  FHR: 135 bpm, variability: moderate,  accelerations:  Present,  decelerations:  Present variable decel noted x 1 with UC Category/reactivity:  Category I UC:   irregular, every 2-6 minutes, palpate moderate SVE:   6/90/-1, midposition, soft. No palpable BBOW noted, membranes felt smoothly across fetal vertex. Normal amount bloody show.   Membrane status:SROM [id number] Amniotic color: clear but scant  Labs: Lab Results  Component Value Date   WBC 6.8 12/18/2020   HGB 11.3 (L) 12/18/2020   HCT 33.9 (L) 12/18/2020   MCV 90.6 12/18/2020   PLT 171 12/18/2020   OB US: AFI 8.6cm, vertex, anterior placenta, BPD [redacted]w[redacted]d   Assessment / Plan: Spontaneous labor, progressing normally Olioghydramnios: AFI 8.6cm Preterm labor, [redacted]w[redacted]d  Labor: Progressing normally, will AROM forebag after epidural.  Preeclampsia:   asymptomatic, BP remains persistently 130/80s.  Fetal Wellbeing:  Category I Pain Control:   s/p IVPM x 1, requesting epidural now.  I/D:   GBS Neg Anticipated MOD:  NSVD  Prudencio Pair Burech Mcfarland, CNM 12/19/2020, 6:30 AM

## 2020-12-19 NOTE — Discharge Summary (Signed)
Obstetrical Discharge Summary  Patient Name: Christine Knox DOB: [birth year] MRN: [id number]  Date of Admission: 12/18/2020 Date of Delivery: 12/19/2020 Delivered by: Drinda Butts, CNM  Date of Discharge: 12/21/2020  Primary OB: [facility] LMP:No LMP recorded. EDC Estimated Date of Delivery: 01/11/21 Gestational Age at Delivery: [redacted]w[redacted]d  Antepartum complications:  Rubella non-immune  Maternal iron-deficiency anemia in pregnancy  Uterine S<D   Admitting Diagnosis: Preterm uterine contractions in third trimester, antepartum [O47.03] Preterm labor [O60.00]  Secondary Diagnosis: Patient Active Problem List   Diagnosis Date Noted   NSVD (normal spontaneous vaginal delivery) 12/21/2020   Acute blood loss anemia 12/21/2020   Maternal iron deficiency anemia complicating pregnancy in third trimester 11/18/2020   Uterine size date discrepancy, third trimester 11/18/2020   Supervision of normal first pregnancy 06/28/2020    Augmentation: N/A Complications: None Intrapartum complications/course: Christine Knox, presented to L&D with regular painful contractions and was admitted for preterm labor.  Her labor progressed more rapidly after SROM.  She progressed spontaneously to C/C/+1 but did not have an urge to push d/t the epidural.  The epidural rate was decreased and she reported pressure after an hour.  [name] assisted with pushing techniques and pushed effectively over approximately 40 minutes for a spontaneous vaginal birth. Delivery Type: spontaneous vaginal delivery Anesthesia: epidural Placenta: spontaneous Laceration: Bilateral labial w/ repair  Episiotomy: none Newborn Data: Live born female  Birth Weight: 5 lb 15.2 oz (2700 g) APGAR: 8, 9  Newborn Delivery   Birth date/time: 12/19/2020 12:45:00 Delivery type: Vaginal, Spontaneous      Postpartum Procedures: none Edinburgh:  Edinburgh Postnatal Depression Scale Screening Tool 12/20/2020  I have been able to  laugh and see the funny side of things. 0  I have looked forward with enjoyment to things. 0  I have blamed myself unnecessarily when things went wrong. 0  I have been anxious or worried for no good reason. 0  I have felt scared or panicky for no good reason. 1  Things have been getting on top of me. 0  I have been so unhappy that I have had difficulty sleeping. 0  I have felt sad or miserable. 0  I have been so unhappy that I have been crying. 0  The thought of harming myself has occurred to me. 0  Edinburgh Postnatal Depression Scale Total 1     Post partum course:  Patient had an uncomplicated postpartum course.  By time of discharge on PPD#2, her pain was controlled on oral pain medications; she had appropriate lochia and was ambulating, voiding without difficulty and tolerating regular diet.  She was deemed stable for discharge to home.    Discharge Physical Exam:  BP 109/81 (BP Location: Right Arm)   Pulse 60   Temp 98 F (36.7 C) (Oral)   Resp 18   Ht 5' (1.524 m)   Wt 59 kg   SpO2 100% Comment: Room Air  Breastfeeding Unknown   BMI 25.39 kg/m   General: NAD CV: RRR Pulm: CTABL, nl effort ABD: s/nd/nt, fundus firm and below the umbilicus Lochia: moderate Perineum:minimal edema/repair well approximated DVT Evaluation: LE non-ttp, no evidence of DVT on exam.  Hemoglobin  Date Value Ref Range Status  12/20/2020 9.7 (L) 12.0 - 15.0 g/dL Final   HCT  Date Value Ref Range Status  12/20/2020 29.4 (L) 36.0 - 46.0 % Final     Disposition: stable, discharge to home. Baby Feeding: breastmilk Baby Disposition: home with mom  Rh Immune  globulin given: Rh pos  Rubella vaccine given: Non-Immune - MMR offered postpartum  Varivax vaccine given: Immune  Flu vaccine given in AP or PP setting: Offer postpartum  Tdap vaccine given in AP or PP setting: given 10/17/2020  Contraception: TBD  Prenatal Labs:  Blood type/Rh O pos  Antibody screen neg  Rubella Non-Immune   Varicella Immune  RPR NR  HBsAg Neg  HIV NR  GC neg  Chlamydia neg  Genetic screening declined  1 hour GTT  118  3 hour GTT    GBS  Neg    Plan:  Christine Knox was discharged to home in good condition. Follow-up appointment with delivering provider in 6 weeks.  Discharge Medications: Allergies as of 12/21/2020   No Known Allergies      Medication List     TAKE these medications    IRON SUPPLEMENT PO Take 1 tablet by mouth. Iron gummies over the counter   PRENATAL VITAMINS PO Take by mouth.         Follow-up Information     Minda Meo, CNM. Schedule an appointment as soon as possible for a visit in 6 week(s).   Specialty: Certified Nurse Midwife Contact information: [place] [address] [phone]                 Signed: Clydene Laming 12/21/2020 8:25 AM

## 2020-12-19 NOTE — Progress Notes (Signed)
Labor Progress Note  Christine Knox is a 25 y.o. G1P0 at [redacted]w[redacted]d by LMP admitted for Preterm labor  Subjective: feeling constant pressure   Objective: BP 119/77 (BP Location: Right Arm)   Pulse 66   Temp 98 F (36.7 C) (Oral)   Resp 16   Ht 5' (1.524 m)   Wt 59 kg   SpO2 97%   BMI 25.39 kg/m  Notable VS details: reviewed   Fetal Assessment: FHT:  FHR: 135 bpm, variability: moderate,  accelerations:  Present,  decelerations:  Absent Category/reactivity:  Category I UC:   regular, every 2-3 minutes SVE:   10/100/+2 Membrane status: SROM at 0448 Amniotic color: Clear   Labs: Lab Results  Component Value Date   WBC 6.8 12/18/2020   HGB 11.3 (L) 12/18/2020   HCT 33.9 (L) 12/18/2020   MCV 90.6 12/18/2020   PLT 171 12/18/2020    Assessment / Plan: Spontaneous labor, progressing normally -Passive 2nd stage x 1 hour d/t decreased sensation after epidural -Now feeling pressure with contractions -Will start pushing   Labor: Progressing normally Fetal Wellbeing:  Category I Pain Control:  Epidural I/D:   SROM x 7 hours, afebrile, GBS negative Anticipated MOD:  NSVD  Gustavo Lah, CNM 12/19/2020, 12:16 PM

## 2020-12-20 DIAGNOSIS — O9081 Anemia of the puerperium: Secondary | ICD-10-CM | POA: Diagnosis not present

## 2020-12-20 LAB — CBC
HCT: 29.4 % — ABNORMAL LOW (ref 36.0–46.0)
Hemoglobin: 9.7 g/dL — ABNORMAL LOW (ref 12.0–15.0)
MCH: 29.9 pg (ref 26.0–34.0)
MCHC: 33 g/dL (ref 30.0–36.0)
MCV: 90.7 fL (ref 80.0–100.0)
Platelets: 141 10*3/uL — ABNORMAL LOW (ref 150–400)
RBC: 3.24 MIL/uL — ABNORMAL LOW (ref 3.87–5.11)
RDW: 18.6 % — ABNORMAL HIGH (ref 11.5–15.5)
WBC: 8.6 10*3/uL (ref 4.0–10.5)
nRBC: 0 % (ref 0.0–0.2)

## 2020-12-20 MED ORDER — FERROUS SULFATE 325 (65 FE) MG PO TABS
325.0000 mg | ORAL_TABLET | Freq: Two times a day (BID) | ORAL | Status: DC
  Administered 2020-12-20 – 2020-12-21 (×3): 325 mg via ORAL
  Filled 2020-12-20 (×3): qty 1

## 2020-12-20 NOTE — Progress Notes (Signed)
Postpartum Day  1  Subjective: no complaints, up ad lib, voiding, tolerating PO, and + flatus  Doing well, no concerns. Ambulating without difficulty, pain managed with PO meds, tolerating regular diet, and voiding without difficulty.   No fever/chills, chest pain, shortness of breath, nausea/vomiting, or leg pain. No nipple or breast pain. No headache, visual changes, or RUQ/epigastric pain.  Objective: BP 108/63 (BP Location: Right Arm)   Pulse 60   Temp 98.4 F (36.9 C) (Oral)   Resp 18   Ht 5' (1.524 m)   Wt 59 kg   SpO2 99%   Breastfeeding Unknown   BMI 25.39 kg/m    Physical Exam:  General: alert, cooperative, and appears stated age Breasts: soft/nontender CV: RRR Pulm: nl effort, CTABL Abdomen: soft, non-tender, active bowel sounds Uterine Fundus: firm Perineum: minimal edema, repair well approximated Lochia: appropriate DVT Evaluation: No evidence of DVT seen on physical exam. Negative Homan's sign.  Recent Labs    12/18/20 2324 12/20/20 0635  HGB 11.3* 9.7*  HCT 33.9* 29.4*  WBC 6.8 8.6  PLT 171 141*    Assessment/Plan: 24 y.o. G1P0101 postpartum day # 1  -Continue routine postpartum care Encouraged snug fitting bra, cold application, Tylenol PRN, and cabbage leaves for engorgement for formula feeding  -Discussed contraceptive options including implant, IUDs hormonal and non-hormonal, injection, pills/ring/patch, condoms, and NFP.  -Acute blood loss anemia - hemodynamically stable and asymptomatic; start PO ferrous sulfate BID with stool softeners  -Immunization status:   needs MMR prior to discharge    Disposition: Continue inpatient postpartum care    LOS: 2 days    ----- Avelino Leeds Certified Nurse Midwife [facility]

## 2020-12-20 NOTE — Anesthesia Postprocedure Evaluation (Signed)
Anesthesia Post Note  Patient: Christine Knox, site  Procedure(s) Performed: AN AD HOC LABOR EPIDURAL  Patient location during evaluation: Mother Baby Anesthesia Type: Epidural Level of consciousness: awake and alert Pain management: pain level controlled Vital Signs Assessment: post-procedure vital signs reviewed and stable Respiratory status: spontaneous breathing, nonlabored ventilation and respiratory function stable Cardiovascular status: stable Postop Assessment: no headache, no backache and epidural receding Anesthetic complications: no   No notable events documented.   Last Vitals:  Vitals:   12/20/20 0306 12/20/20 0803  BP: 122/80 108/63  Pulse: 67 60  Resp: 16 18  Temp:  36.9 C  SpO2: 99% 99%    Last Pain:  Vitals:   12/20/20 0803  TempSrc: Oral  PainSc:                  Rica Mast

## 2020-12-20 NOTE — Lactation Note (Addendum)
This note was copied from a baby's chart. Lactation Consultation Note  Patient Name: Girl Ashayla Subia [id number] Date: 12/20/2020 Reason for consult: Follow-up assessment;Primapara;Late-preterm 34-36.6wks;RN request Age:25 hours  Lactation follow-up. Mom voices desire for baby to be at breast and for pumping. Mom and dad provided bottles of formula throughout the night w/o efforts of breastfeeding.  Baby's last feeding was of 49mL formula around 0730; attempted a breast feed but baby was sleepy and disinterested. Placed skin to skin with mom where she remained asleep.  DEBP was set-up at bedside. Mom and Dad were educated on assembly of the parts/pieces, and function buttons and knobs of the DEBP. Mom fitted for size 28mm NS, some discomfort on R nipple with both 24 and 27- mostly likely coconut oil can help with lubrication. Mom pumped for 15 minutes; drops expressed given to baby by RN.  Parents educated on cleaning of the parts and pieces, milk storage, and frequency of every 3 hours.   Parents encouraged to call with next early feeding cues to attempt a feed at the breast. Parents verbalize understanding.  Maternal Data Has patient been taught Hand Expression?: Yes Does the patient have breastfeeding experience prior to this delivery?: No  Feeding Mother's Current Feeding Choice: Breast Milk and Formula  LATCH Score Latch: Too sleepy or reluctant, no latch achieved, no sucking elicited.                  Lactation Tools Discussed/Used Tools: Pump Breast pump type: Double-Electric Breast Pump Pump Education: Setup, frequency, and cleaning;Milk Storage Reason for Pumping: mom's choice Pumping frequency: q 3hrs  Interventions Interventions: Breast feeding basics reviewed;Assisted with latch;Hand express;Support pillows;DEBP;Education  Discharge    Consult Status Consult Status: Follow-up Date: 12/20/20 Follow-up type: In-patient    Danford Bad 12/20/2020,  12:25 PM

## 2020-12-21 DIAGNOSIS — D62 Acute posthemorrhagic anemia: Secondary | ICD-10-CM | POA: Diagnosis not present

## 2020-12-21 NOTE — Progress Notes (Signed)
Mother discharged.  Discharge instructions given.  Mother verbalizes understanding.  Transported by auxiliary.  

## 2020-12-21 NOTE — Lactation Note (Signed)
This note was copied from a baby's chart. Lactation Consultation Note  Patient Name: Christine Knox [id number] Date: 12/21/2020 Reason for consult: Follow-up assessment;Primapara;Late-preterm 34-36.6wks Age:25 hours  Lactation follow-up prior to anticipated discharge. Mom desires to breast and bottle feed. Mom has been given DEBP at bedside, and has been offered several times to assist with breastfeeding efforts, however has declined, or has not continued stimulation throughout the night. LC spoke with parents this morning about normal course of lactation and milk supply and demand. Discussed stimulation needed to promote the onset of milk production, through baby and/or the pump. Mom verbalizes understanding and states they will "continue to work on latching baby at home".  Guidance given for outpatient lactation support to assist with breastfeeding as needed.  Reviewed with parents stomach size, recommended feeding patterns, and paced bottle feeding with use of formula and EBM. Information given for anticipated breast changes and management of breast fullness and engorgement and nipple care.  Encouraged to call out with questions and for ongoing BF support as needed.  Maternal Data Has patient been taught Hand Expression?: Yes Does the patient have breastfeeding experience prior to this delivery?: No  Feeding Mother's Current Feeding Choice: Breast Milk and Formula Nipple Type: Slow - flow  LATCH Score                    Lactation Tools Discussed/Used Tools: Pump;Coconut oil Breast pump type: Double-Electric Breast Pump Reason for Pumping: Mom's choice Pumping frequency: has not used  Interventions Interventions: Breast feeding basics reviewed;Hand express;Pre-pump if needed;Coconut oil;Hand pump;Education;Pace feeding  Discharge Discharge Education: Engorgement and breast care;Warning signs for feeding baby;Outpatient recommendation Pump: Manual  Consult  Status Consult Status: Complete Date: 12/21/20 Follow-up type: Call as needed    Danford Bad 12/21/2020, 9:47 AM

## 2020-12-21 NOTE — Discharge Instructions (Signed)

## 2020-12-22 ENCOUNTER — Telehealth: Payer: Self-pay

## 2020-12-22 NOTE — Telephone Encounter (Signed)
Transition Care Management Unsuccessful Follow-up Telephone Call  Date of discharge and from where:  12/21/2020-[facility]  Attempts:  1st Attempt  Reason for unsuccessful TCM follow-up call:  Left voice message    

## 2020-12-23 NOTE — Telephone Encounter (Signed)
Transition Care Management Unsuccessful Follow-up Telephone Call  Date of discharge and from where:  12/21/2020 from [facility]  Attempts:  2nd Attempt  Reason for unsuccessful TCM follow-up call:  Left voice message

## 2020-12-26 NOTE — Telephone Encounter (Signed)
Transition Care Management Unsuccessful Follow-up Telephone Call  Date of discharge and from where:  12/21/2020 from [facility]  Attempts:  3rd Attempt  Reason for unsuccessful TCM follow-up call:  Unable to reach patient

## 2021-06-08 DIAGNOSIS — N912 Amenorrhea, unspecified: Secondary | ICD-10-CM | POA: Diagnosis not present

## 2021-07-13 DIAGNOSIS — Z3009 Encounter for other general counseling and advice on contraception: Secondary | ICD-10-CM | POA: Diagnosis not present

## 2021-07-13 DIAGNOSIS — Z9889 Other specified postprocedural states: Secondary | ICD-10-CM | POA: Diagnosis not present

## 2021-07-20 DIAGNOSIS — Z30430 Encounter for insertion of intrauterine contraceptive device: Secondary | ICD-10-CM | POA: Diagnosis not present

## 2021-07-20 DIAGNOSIS — Z113 Encounter for screening for infections with a predominantly sexual mode of transmission: Secondary | ICD-10-CM | POA: Diagnosis not present

## 2022-04-26 DIAGNOSIS — R399 Unspecified symptoms and signs involving the genitourinary system: Secondary | ICD-10-CM | POA: Diagnosis not present

## 2022-06-21 DIAGNOSIS — H1033 Unspecified acute conjunctivitis, bilateral: Secondary | ICD-10-CM | POA: Diagnosis not present

## 2022-07-17 ENCOUNTER — Other Ambulatory Visit: Payer: Self-pay | Admitting: Family Medicine

## 2022-07-17 ENCOUNTER — Ambulatory Visit
Admission: RE | Admit: 2022-07-17 | Discharge: 2022-07-17 | Disposition: A | Discharge status: Home / Self Care | Payer: Medicaid Other | Source: Ambulatory Visit | Attending: Family Medicine | Admitting: Family Medicine

## 2022-07-17 DIAGNOSIS — R1011 Right upper quadrant pain: Secondary | ICD-10-CM

## 2022-07-17 DIAGNOSIS — R110 Nausea: Secondary | ICD-10-CM

## 2022-07-17 DIAGNOSIS — K219 Gastro-esophageal reflux disease without esophagitis: Secondary | ICD-10-CM

## 2022-07-17 DIAGNOSIS — R140 Abdominal distension (gaseous): Secondary | ICD-10-CM

## 2022-07-17 DIAGNOSIS — R102 Pelvic and perineal pain: Secondary | ICD-10-CM | POA: Diagnosis not present

## 2022-09-19 DIAGNOSIS — F411 Generalized anxiety disorder: Secondary | ICD-10-CM | POA: Diagnosis not present

## 2022-10-29 DIAGNOSIS — F411 Generalized anxiety disorder: Secondary | ICD-10-CM | POA: Diagnosis not present

## 2022-12-03 DIAGNOSIS — F411 Generalized anxiety disorder: Secondary | ICD-10-CM | POA: Diagnosis not present

## 2022-12-03 DIAGNOSIS — R5383 Other fatigue: Secondary | ICD-10-CM | POA: Diagnosis not present

## 2022-12-03 DIAGNOSIS — K219 Gastro-esophageal reflux disease without esophagitis: Secondary | ICD-10-CM | POA: Diagnosis not present

## 2022-12-05 DIAGNOSIS — D649 Anemia, unspecified: Secondary | ICD-10-CM | POA: Diagnosis not present

## 2022-12-05 DIAGNOSIS — E538 Deficiency of other specified B group vitamins: Secondary | ICD-10-CM | POA: Diagnosis not present

## 2022-12-05 DIAGNOSIS — F411 Generalized anxiety disorder: Secondary | ICD-10-CM | POA: Diagnosis not present

## 2022-12-05 DIAGNOSIS — R5383 Other fatigue: Secondary | ICD-10-CM | POA: Diagnosis not present

## 2022-12-05 DIAGNOSIS — K219 Gastro-esophageal reflux disease without esophagitis: Secondary | ICD-10-CM | POA: Diagnosis not present

## 2023-05-16 DIAGNOSIS — F411 Generalized anxiety disorder: Secondary | ICD-10-CM | POA: Diagnosis not present

## 2023-05-16 DIAGNOSIS — D509 Iron deficiency anemia, unspecified: Secondary | ICD-10-CM | POA: Diagnosis not present

## 2023-05-16 DIAGNOSIS — R5383 Other fatigue: Secondary | ICD-10-CM | POA: Diagnosis not present

## 2023-05-16 DIAGNOSIS — R519 Headache, unspecified: Secondary | ICD-10-CM | POA: Diagnosis not present

## 2023-05-16 DIAGNOSIS — Z0000 Encounter for general adult medical examination without abnormal findings: Secondary | ICD-10-CM | POA: Diagnosis not present

## 2023-05-16 DIAGNOSIS — Z79899 Other long term (current) drug therapy: Secondary | ICD-10-CM | POA: Diagnosis not present

## 2023-07-12 DIAGNOSIS — Z111 Encounter for screening for respiratory tuberculosis: Secondary | ICD-10-CM | POA: Diagnosis not present

## 2023-07-12 DIAGNOSIS — Z01419 Encounter for gynecological examination (general) (routine) without abnormal findings: Secondary | ICD-10-CM | POA: Diagnosis not present

## 2023-07-12 DIAGNOSIS — Z0289 Encounter for other administrative examinations: Secondary | ICD-10-CM | POA: Diagnosis not present

## 2023-07-12 DIAGNOSIS — Z124 Encounter for screening for malignant neoplasm of cervix: Secondary | ICD-10-CM | POA: Diagnosis not present

## 2023-07-12 DIAGNOSIS — Z1151 Encounter for screening for human papillomavirus (HPV): Secondary | ICD-10-CM | POA: Diagnosis not present

## 2023-09-23 ENCOUNTER — Other Ambulatory Visit: Payer: Self-pay | Admitting: Family Medicine

## 2023-09-23 DIAGNOSIS — N6452 Nipple discharge: Secondary | ICD-10-CM | POA: Diagnosis not present

## 2023-09-23 DIAGNOSIS — N644 Mastodynia: Secondary | ICD-10-CM | POA: Diagnosis not present

## 2023-09-25 ENCOUNTER — Ambulatory Visit
Admission: RE | Admit: 2023-09-25 | Discharge: 2023-09-25 | Disposition: A | Discharge status: Home / Self Care | Source: Ambulatory Visit | Attending: Family Medicine | Admitting: Family Medicine

## 2023-09-25 DIAGNOSIS — N644 Mastodynia: Secondary | ICD-10-CM | POA: Insufficient documentation

## 2023-09-25 DIAGNOSIS — N6452 Nipple discharge: Secondary | ICD-10-CM | POA: Insufficient documentation

## 2024-02-19 DIAGNOSIS — J111 Influenza due to unidentified influenza virus with other respiratory manifestations: Secondary | ICD-10-CM | POA: Diagnosis not present

## 2024-02-19 DIAGNOSIS — J028 Acute pharyngitis due to other specified organisms: Secondary | ICD-10-CM | POA: Diagnosis not present

## 2024-02-19 DIAGNOSIS — R051 Acute cough: Secondary | ICD-10-CM | POA: Diagnosis not present

## 2024-02-19 DIAGNOSIS — Z03818 Encounter for observation for suspected exposure to other biological agents ruled out: Secondary | ICD-10-CM | POA: Diagnosis not present

## 2024-02-19 DIAGNOSIS — R509 Fever, unspecified: Secondary | ICD-10-CM | POA: Diagnosis not present

## 2024-02-27 DIAGNOSIS — B349 Viral infection, unspecified: Secondary | ICD-10-CM | POA: Diagnosis not present

## 2024-02-27 DIAGNOSIS — R0789 Other chest pain: Secondary | ICD-10-CM | POA: Diagnosis not present

## 2024-02-27 DIAGNOSIS — R053 Chronic cough: Secondary | ICD-10-CM | POA: Diagnosis not present

## 2024-04-09 ENCOUNTER — Other Ambulatory Visit: Payer: Self-pay

## 2024-04-09 ENCOUNTER — Emergency Department: Admission: EM | Admit: 2024-04-09 | Discharge: 2024-04-09 | Disposition: A | Discharge status: Home / Self Care

## 2024-04-09 DIAGNOSIS — H15003 Unspecified scleritis, bilateral: Secondary | ICD-10-CM | POA: Diagnosis not present

## 2024-04-09 DIAGNOSIS — R55 Syncope and collapse: Secondary | ICD-10-CM | POA: Insufficient documentation

## 2024-04-09 LAB — CBC WITH DIFFERENTIAL/PLATELET
Abs Immature Granulocytes: 0.01 K/uL (ref 0.00–0.07)
Basophils Absolute: 0 K/uL (ref 0.0–0.1)
Basophils Relative: 1 %
Eosinophils Absolute: 0.1 K/uL (ref 0.0–0.5)
Eosinophils Relative: 1 %
HCT: 40.3 % (ref 36.0–46.0)
Hemoglobin: 13.7 g/dL (ref 12.0–15.0)
Immature Granulocytes: 0 %
Lymphocytes Relative: 23 %
Lymphs Abs: 1.3 K/uL (ref 0.7–4.0)
MCH: 31.7 pg (ref 26.0–34.0)
MCHC: 34 g/dL (ref 30.0–36.0)
MCV: 93.3 fL (ref 80.0–100.0)
Monocytes Absolute: 0.4 K/uL (ref 0.1–1.0)
Monocytes Relative: 7 %
Neutro Abs: 3.9 K/uL (ref 1.7–7.7)
Neutrophils Relative %: 68 %
Platelets: 221 K/uL (ref 150–400)
RBC: 4.32 MIL/uL (ref 3.87–5.11)
RDW: 11.5 % (ref 11.5–15.5)
WBC: 5.7 K/uL (ref 4.0–10.5)
nRBC: 0 % (ref 0.0–0.2)

## 2024-04-09 LAB — BASIC METABOLIC PANEL WITH GFR
Anion gap: 10 (ref 5–15)
BUN: 11 mg/dL (ref 6–20)
CO2: 24 mmol/L (ref 22–32)
Calcium: 8.8 mg/dL — ABNORMAL LOW (ref 8.9–10.3)
Chloride: 106 mmol/L (ref 98–111)
Creatinine, Ser: 0.56 mg/dL (ref 0.44–1.00)
GFR, Estimated: >60 mL/min
Glucose, Bld: 92 mg/dL (ref 70–99)
Potassium: 3.7 mmol/L (ref 3.5–5.1)
Sodium: 140 mmol/L (ref 135–145)

## 2024-04-09 LAB — D-DIMER, QUANTITATIVE: D-Dimer, Quant: 0.39 ug{FEU}/mL (ref 0.00–0.50)

## 2024-04-09 LAB — POC URINE PREG, ED: Preg Test, Ur: NEGATIVE

## 2024-04-09 MED ORDER — SODIUM CHLORIDE 0.9 % IV BOLUS
1000.0000 mL | Freq: Once | INTRAVENOUS | Status: AC
  Administered 2024-04-09: 1000 mL via INTRAVENOUS

## 2024-04-09 NOTE — ED Provider Notes (Signed)
 "  [facility] Provider Note    Event Date/Time   First MD Initiated Contact with Patient 04/09/24 1025     (approximate)   History   Near Syncope   HPI  Christine Knox is a 29 y.o. 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  Patient denies any previous history of syncope.  Patient reports that she felt well prior to getting her blood drawn.  She reports after needle insertion she felt lightheaded and felt as if her bilateral hands were becoming cold.  Per report patient did syncopize.  She denies any chest pain or shortness of breath.  She is not taking any medications including OCPs.  She reports that her symptoms are gradually improving      Physical Exam   Triage Vital Signs: ED Triage Vitals  Encounter Vitals Group     BP 04/09/24 1026 116/77     Girls Systolic BP Percentile --      Girls Diastolic BP Percentile --      Boys Systolic BP Percentile --      Boys Diastolic BP Percentile --      Pulse Rate 04/09/24 1026 74     Resp 04/09/24 1026 (!) 28     Temp 04/09/24 1029 98.7 F (37.1 C)     Temp Source 04/09/24 1029 Oral     SpO2 04/09/24 1026 100 %     Weight 04/09/24 1026 109 lb (49.4 kg)     Height 04/09/24 1026 5' 1 (1.549 m)     Head Circumference --      Peak Flow --      Pain Score 04/09/24 1026 0     Pain Loc --      Pain Education --      Exclude from Growth Chart --     Most recent vital signs: Vitals:   04/09/24 1400 04/09/24 1421  BP: 107/62   Pulse: 64   Resp: 16   Temp:  98.8 F (37.1 C)  SpO2: 100%     Nursing Triage Note reviewed. Vital signs reviewed and patients oxygen saturation is normoxic  General: Patient is well nourished, well developed, awake and alert, resting comfortably in no acute distress Head: Normocephalic and atraumatic Eyes: Normal inspection, extraocular muscles intact, bilateral scleral  erythema Ear, nose, throat: Normal external exam Neck: Normal range of motion Respiratory: Patient is in no respiratory distress, lungs CTAB Cardiovascular: Patient is not tachycardic, RRR without murmur appreciated GI: Abd SNT with no guarding or rebound  Back: Normal inspection of the back with good strength and range of motion throughout all ext Extremities: pulses intact with good cap refills, no LE pitting edema or calf tenderness Neuro: The patient is alert and oriented to person, place, and time, appropriately conversive, with 5/5 bilat UE/LE strength, no gross motor or sensory defects noted. Coordination appears to be adequate. Skin: Warm, dry, and intact Psych: normal mood and affect, no SI or HI  ED Results / Procedures / Treatments   Labs (all labs ordered are listed, but only abnormal results are displayed) Labs Reviewed  BASIC METABOLIC PANEL WITH GFR - Abnormal; Notable for the following components:      Result Value   Calcium 8.8 (*)    All other components within normal limits  CBC WITH DIFFERENTIAL/PLATELET  D-DIMER, QUANTITATIVE  POC URINE PREG, ED     EKG EKG and  rhythm strip are interpreted by myself:   EKG: [Normal sinus rhythm] at heart rate of 65, normal QRS duration, QTc 414, nonspecific ST segments and T waves no ectopy EKG not consistent with Acute STEMI Rhythm strip: NSR in lead II   RADIOLOGY None    PROCEDURES:  Critical Care performed: No  Procedures   MEDICATIONS ORDERED IN ED: Medications  sodium chloride  0.9 % bolus 1,000 mL (0 mLs Intravenous Stopped 04/09/24 1421)     IMPRESSION / MDM / ASSESSMENT AND PLAN / ED COURSE                                Differential diagnosis includes, but is not limited to, arrhythmia, PE, anemia, pregnancy, orthostasis, vasovagal   ED course: Patient presents and she is well-appearing and not hypotensive or hypoxic.  EKG demonstrated normal sinus rhythm however she does have a RSR'.  Given  this and whole syncope episode, decision made to obtain a D-dimer which was not elevated.  Patient's urine pregnancy test was not elevated.  She was not anemic and had no leukocytosis.  She had no profound electrolyte derangements requiring repletion.  She was given 1 L of IV fluid.  Patient was able to ambulate without any lightheadedness and take p.o.  She feels comfortable with discharge at this time.  She will follow-up with her primary care physician.  She will return with any recurrent episodes of syncope.  I have provided a work note for the next 48 hours   Clinical Course as of 04/09/24 1648  Thu Apr 09, 2024  1055 Pulse Rate: 74 Not tachycardic [HD]  1326 Patient reassessed feels improved, if D-dimer negative feels comfortable returning home [HD]  1403 D-Dimer, Quant: 0.39 Not elevated [HD]  1405 Patient feels improved.  Feels comfortable returning home.  Will provide a work note for 48 hours.  All questions answered patient voiced understanding [HD]    Clinical Course User Index [HD] Nicholaus Rolland BRAVO, MD   At time of discharge there is no evidence of acute life, limb, vision, or fertility threat. Patient has stable vital signs, pain is well controlled, patient is ambulatory and p.o. tolerant.  Discharge instructions were completed using the EPIC system. I would refer you to those at this time. All warnings prescriptions follow-up etc. were discussed in detail with the patient. Patient indicates understanding and is agreeable with this plan. All questions answered.  Patient is made aware that they may return to the emergency department for any worsening or new condition or for any other emergency.  -- Risk: 5 This patient has a high risk of morbidity due to further diagnostic testing or treatment. Rationale: This patients evaluation and management involve a high risk of morbidity due to the potential severity of presenting symptoms, need for diagnostic testing, and/or initiation of  treatment that may require close monitoring. The differential includes conditions with potential for significant deterioration or requiring escalation of care. Treatment decisions in the ED, including medication administration, procedural interventions, or disposition planning, reflect this level of risk. COPA: 5 The patient has the following acute or chronic illness/injury that poses a possible threat to life or bodily function: [X] : The patient has a potentially serious acute condition or an acute exacerbation of a chronic illness requiring urgent evaluation and management in the Emergency Department. The clinical presentation necessitates immediate consideration of life-threatening or function-threatening diagnoses, even if they are ultimately ruled out.  FINAL CLINICAL IMPRESSION(S) / ED DIAGNOSES   Final diagnoses:  Syncope, unspecified syncope type  Bilateral scleritis     Rx / DC Orders   ED Discharge Orders     None        Note:  This document was prepared using Dragon voice recognition software and may include unintentional dictation errors.   Nicholaus Rolland BRAVO, MD 04/09/24 [phone]  "

## 2024-04-09 NOTE — Progress Notes (Signed)
 History of Present Illness:   Christine Knox is a 29 y.o. female here for   Verbally consented to the use of AI for note-taking.   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  She has been experiencing symptoms of idiopathic scleritis for almost three weeks. Initially, the symptoms improved over a weekend but recurred the following Monday. She consulted an Ophthalmologist two Fridays ago, who prescribed ketorolac eye drops. After a week of use without improvement, the symptoms unexpectedly improved on the day of her follow-up visit, leading to discontinuation of the drops. However, the symptoms returned on Sunday night, accompanied by slight blurriness in vision. She describes the sensation in her eye as feeling like 'wet toilet paper' and notes that it is not painful but feels unusual. The redness has persisted despite the use of eye drops. No joint pain, rashes, fevers, or chills.   She has a history of low iron  levels and suspects she may have decreased again, as she is experiencing low energy. She is not currently taking vitamin D or vitamin B12 supplements, but she is using multivitamins.   Past Medical History:   Past Medical History:  Diagnosis Date   Anemia    GERD (gastroesophageal reflux disease)     Past Surgical History:  History reviewed. No pertinent surgical history.  Allergies:  No Known Allergies  Current Medications:   Prior to Admission medications  Medication Sig Taking? Last Dose  escitalopram oxalate (LEXAPRO) 10 MG tablet Take 1 tablet (10 mg total) by mouth once daily Yes Taking  omeprazole (PRILOSEC OTC) 20 MG EC tablet Take 20 mg by mouth once daily Yes PRN Not Currently Taking  albuterol MDI, PROVENTIL, VENTOLIN, PROAIR, HFA 90 mcg/actuation inhaler Inhale 2 inhalations into the lungs every 6 (six)  hours as needed for Wheezing, Shortness of Breath or Cough Patient not taking: Reported on 04/09/2024  Not Taking  busPIRone (BUSPAR) 5 MG tablet Take 1 tablet daily in the morning for 1 week then increase to 1 tablet in the morning and 1 tablet at lunch time. Patient not taking: Reported on 09/23/2023  Not Taking  promethazine-dextromethorphan (PROMETHAZINE-DM) 6.25-15 mg/5 mL syrup Take 5 mLs by mouth every 6 (six) hours as needed for Cough Maximum 30 mL / 24-hour Patient not taking: Reported on 04/09/2024  Not Taking    Family History:   Family History  Problem Relation Name Age of Onset   High blood pressure (Hypertension) Mother Health And Safety Inspector     Social History:   Social History   Socioeconomic History   Marital status: Single  Tobacco Use   Smoking status: Never   Smokeless tobacco: Never  Vaping Use   Vaping status: Never Used  Substance and Sexual Activity   Alcohol use: Not Currently   Drug use: Never   Sexual activity: Yes    Partners: Male    Birth control/protection: Condom, None  Other Topics Concern   Would you please tell us  about the people who live in your home, your pets, or anything else important to your social life? No   Social Drivers of Corporate Investment Banker Strain: Low Risk  (02/27/2024)   Overall Financial Resource Strain (CARDIA)    Difficulty of Paying Living Expenses: Not hard at all  Food Insecurity: No Food Insecurity (02/27/2024)   Hunger Vital Sign  Worried About Programme Researcher, Broadcasting/film/video in the Last Year: Never true    [employer] of Food in the Last Year: Never true  Transportation Needs: No Transportation Needs (02/27/2024)   PRAPARE - Administrator, Civil Service (Medical): No    Lack of Transportation (Non-Medical): No  Housing Stability: Low Risk  (02/27/2024)   Housing Stability Vital Sign    Unable to Pay for Housing in the Last Year: No    Number of Times Moved in the Last Year: 0    Homeless in the Last  Year: No    Review of Systems:   A 10 point review of systems is negative, except for the pertinent positives and negatives detailed in the HPI.  Vitals:   Vitals:   04/09/24 0910  BP: 116/82  Pulse: 80  SpO2: 99%  Weight: 49.4 kg (109 lb)  Height: 154.9 cm (5' 1)     Body mass index is 20.6 kg/m.  Physical Exam:   Physical Exam Vitals and nursing note reviewed.  Constitutional:      General: She is not in acute distress.    Appearance: Normal appearance. She is not ill-appearing, toxic-appearing or diaphoretic.  HENT:     Head: Normocephalic and atraumatic.     Right Ear: External ear normal.     Left Ear: External ear normal.  Eyes:     General: Scleral icterus present.        Right eye: No discharge.        Left eye: No discharge.     Extraocular Movements:     Right eye: Normal extraocular motion.     Left eye: Normal extraocular motion.     Conjunctiva/sclera: Conjunctivae normal.   Cardiovascular:     Rate and Rhythm: Normal rate and regular rhythm.     Pulses: Normal pulses.     Heart sounds: Normal heart sounds. No murmur heard.    No friction rub. No gallop.  Pulmonary:     Effort: Pulmonary effort is normal. No respiratory distress.     Breath sounds: Normal breath sounds. No stridor. No wheezing, rhonchi or rales.  Chest:     Chest wall: No tenderness.  Skin:    General: Skin is warm and dry.     Capillary Refill: Capillary refill takes less than 2 seconds.  Neurological:     Mental Status: She is alert.     Assessment and Plan:  No results found for this visit on 04/09/24.  Diagnoses and all orders for this visit:  Idiopathic scleritis -     ANA w/Reflex if Positive - [facility] -     C-Reactive Protein, Quant - [facility] -     Sedimentation Rate-Automated -     Rheumatoid Arthritis Factor - [facility] -     Comprehensive Metabolic Panel (CMP)  Vitamin D deficiency -     Vitamin D, 25-Hydroxy - [facility]  Vitamin B12 deficiency -      Vitamin B12  Anemia, unspecified type -     Ferritin -     Iron  Panel -     CBC w/auto Differential (5 Part) -     Comprehensive Metabolic Panel (CMP)    Assessment & Plan Idiopathic scleritis Intermittent symptoms for almost three weeks, including eye irritation and blurry vision. No pain, joint pain, rashes, fevers, or chills. Previous treatment with ketorolac eye drops provided temporary relief.  - Ordered ANA and inflammation markers - Ordered rheumatoid arthritis panel  Anemia Low iron  levels with symptoms suggestive of recurrence. - Ordered ferritin and iron  panel  Vitamin D deficiency Vitamin D levels not checked recently. - Ordered vitamin D level  Vitamin B12 deficiency Stopped vitamin B12 supplements, currently using multivitamins. - Ordered vitamin B12 level  Depression Currently on Lexapro 10 mg daily. Discussed increasing dose to 15 mg by taking one and a half tablets of 10 mg. - Prescribed Lexapro 10 mg and 5 mg tablets to facilitate dose adjustment   Follow up She will call the office with any new or worsening symptoms   This note has been created using automated tools and reviewed for accuracy by provider.  Patient received an After Visit Summary    Attestation Statement:   I personally performed the service, non-incident to. (WP)   GLENDA MACARIO HADDOCK, NP

## 2024-04-09 NOTE — ED Triage Notes (Addendum)
 Pt sent over from [facility] for syncope. Pt was at clinic for autoimmune lab testing. Pt was getting her blood drawn when she passed out. BP has been labile. Pt alert and able to answer questions. Pt recently diagnosed with idiopathic scleritis. Pt c/o tingling to nose, finger, and toes. Pt says she only had a milkshake this morning and she usually eats a good breakfast.   CBG 95

## 2024-04-09 NOTE — Discharge Instructions (Signed)
 Rest and hydrate for the next 48 hours.  If further episodes of syncope happen please return to the emergency department.  Follow-up with your primary care physician and discuss whether a Zio patch is indicated.  Please return with any acutely worsening symptoms. -- RETURN PRECAUTIONS & AFTERCARE: (ENGLISH) RETURN PRECAUTIONS: Return immediately to the emergency department or see/call your doctor if you feel worse, weak or have changes in speech or vision, are short of breath, have fever, vomiting, pain, bleeding or dark stool, trouble urinating or any new issues. Return here or see/call your doctor if not improving as expected for your suspected condition. FOLLOW-UP CARE: Call your doctor and/or any doctors we referred you to for more advice and to make an appointment. Do this today, tomorrow or after the weekend. Some doctors only take PPO insurance so if you have HMO insurance you may want to contact your HMO or your regular doctor for referral to a specialist within your plan. Either way tell the doctor's office that it was a referral from the emergency department so you get the soonest possible appointment.  YOUR TEST RESULTS: Take result reports of any blood or urine tests, imaging tests and EKG's to your doctor and any referral doctor. Have any abnormal tests repeated. Your doctor or a referral doctor can let you know when this should be done. Also make sure your doctor contacts this hospital to get any test results that are not currently available such as cultures or special tests for infection and final imaging reports, which are often not available at the time you leave the ER but which may list additional important findings that are not documented on the preliminary report. BLOOD PRESSURE: If your blood pressure was greater than 120/80 have your blood pressure rechecked within 1 to 2 weeks. MEDICATION SIDE EFFECTS: Do not drive, walk, bike, take the bus, etc. if you have received or are being  prescribed any sedating medications such as those for pain or anxiety or certain antihistamines like Benadryl . If you have been give one of these here get a taxi home or have a friend drive you home. Ask your pharmacist to counsel you on potential side effects of any new medication

## 2024-04-21 ENCOUNTER — Other Ambulatory Visit: Payer: Self-pay | Admitting: Family Medicine

## 2024-04-21 DIAGNOSIS — R200 Anesthesia of skin: Secondary | ICD-10-CM

## 2024-04-21 DIAGNOSIS — H15009 Unspecified scleritis, unspecified eye: Secondary | ICD-10-CM

## 2024-04-21 DIAGNOSIS — R55 Syncope and collapse: Secondary | ICD-10-CM

## 2024-04-21 DIAGNOSIS — R519 Headache, unspecified: Secondary | ICD-10-CM

## 2024-04-21 DIAGNOSIS — R253 Fasciculation: Secondary | ICD-10-CM

## 2024-04-22 ENCOUNTER — Ambulatory Visit
Admission: RE | Admit: 2024-04-22 | Discharge: 2024-04-22 | Disposition: A | Source: Ambulatory Visit | Attending: Family Medicine | Admitting: Family Medicine

## 2024-04-22 DIAGNOSIS — R253 Fasciculation: Secondary | ICD-10-CM

## 2024-04-22 DIAGNOSIS — H15009 Unspecified scleritis, unspecified eye: Secondary | ICD-10-CM

## 2024-04-22 DIAGNOSIS — R519 Headache, unspecified: Secondary | ICD-10-CM

## 2024-04-22 DIAGNOSIS — R55 Syncope and collapse: Secondary | ICD-10-CM

## 2024-04-22 DIAGNOSIS — R200 Anesthesia of skin: Secondary | ICD-10-CM

## 2024-04-22 MED ORDER — GADOBUTROL 1 MMOL/ML IV SOLN
5.0000 mL | Freq: Once | INTRAVENOUS | Status: AC | PRN
  Administered 2024-04-22: 5 mL via INTRAVENOUS

## 2024-04-23 ENCOUNTER — Other Ambulatory Visit: Payer: Self-pay | Admitting: Medical Genetics
